# Patient Record
Sex: Male | Born: 1990 | Race: White | Hispanic: No | Smoking: Never smoker
Health system: Southern US, Community
[De-identification: ages and names within clinical notes are randomized; demographics above are authoritative.]

## PROBLEM LIST (undated history)

## (undated) DIAGNOSIS — D6851 Activated protein C resistance: Secondary | ICD-10-CM

## (undated) DIAGNOSIS — I82409 Acute embolism and thrombosis of unspecified deep veins of unspecified lower extremity: Secondary | ICD-10-CM

## (undated) DIAGNOSIS — D689 Coagulation defect, unspecified: Secondary | ICD-10-CM

## (undated) DIAGNOSIS — D6852 Prothrombin gene mutation: Secondary | ICD-10-CM

## (undated) HISTORY — DX: Coagulation defect, unspecified: D68.9

## (undated) HISTORY — PX: NO PAST SURGERIES: SHX2092

---

## 2016-11-15 DIAGNOSIS — D6852 Prothrombin gene mutation: Secondary | ICD-10-CM | POA: Insufficient documentation

## 2016-11-15 DIAGNOSIS — D6851 Activated protein C resistance: Secondary | ICD-10-CM | POA: Insufficient documentation

## 2018-03-29 ENCOUNTER — Encounter: Payer: Self-pay | Admitting: Emergency Medicine

## 2018-03-29 ENCOUNTER — Emergency Department
Admission: EM | Admit: 2018-03-29 | Discharge: 2018-03-29 | Disposition: A | Discharge status: Home / Self Care | Payer: Federal, State, Local not specified - PPO | Attending: Emergency Medicine | Admitting: Emergency Medicine

## 2018-03-29 ENCOUNTER — Emergency Department: Payer: Federal, State, Local not specified - PPO

## 2018-03-29 DIAGNOSIS — M79605 Pain in left leg: Secondary | ICD-10-CM | POA: Insufficient documentation

## 2018-03-29 HISTORY — DX: Activated protein C resistance: D68.51

## 2018-03-29 HISTORY — DX: Prothrombin gene mutation: D68.52

## 2018-03-29 HISTORY — DX: Acute embolism and thrombosis of unspecified deep veins of unspecified lower extremity: I82.409

## 2018-03-29 NOTE — ED Triage Notes (Addendum)
Pt comes into the ED via POV c/o left leg pain.  Patient has h/o DVT in the past and states this feels similar.  Patient states the pain does not get worse with ambulation but it is described as a "lingering pain".  Patient states there is some pain in his groin as well but it is minimal at this time.  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

## 2018-03-29 NOTE — ED Notes (Signed)
Patient taken to ultrasound.

## 2018-03-29 NOTE — Discharge Instructions (Addendum)
Please seek medical attention for any high fevers, chest pain, shortness of breath, change in behavior, persistent vomiting, bloody stool or any other new or concerning symptoms.  

## 2018-03-29 NOTE — ED Provider Notes (Signed)
[facility] [place] Emergency Department Provider Note  ____________________________________________   I have reviewed the triage vital signs and the nursing notes.   HISTORY  Chief Complaint Leg Pain   History limited by: Not Limited   HPI Larry Wallace is a 27 y.o. male who presents to the emergency department today because of concerns for left leg pain.  He states that initially the pain was located just above the knee.  He describes it is going in strings.  It then came closer to his groin.  Was somewhat intermittent.  It did somewhat remind the patient when he been diagnosed with DVTs in the past.  He does have factor V Leiden and is on aspirin daily.  He denies any unusual activity or trauma to the leg.  States that he went on to 1 hour flights over the previous weekend.  Feels like he had the flu last week.   Per medical record review patient has a history of DVTs in the past.   Past Medical History:  Diagnosis Date  . DVT (deep venous thrombosis) (HCC)   . Factor 5 Leiden mutation, heterozygous (HCC)   . Prothrombin mutation (HCC)     There are no active problems to display for this patient.   History reviewed. No pertinent surgical history.  Prior to Admission medications   Not on File    Allergies Patient has no known allergies.  No family history on file.  Social History Social History   Tobacco Use  . Smoking status: Never Smoker  . Smokeless tobacco: Never Used  Substance Use Topics  . Alcohol use: Yes    Frequency: Never  . Drug use: Never    Review of Systems Constitutional: Positive for fever last week. Eyes: No visual changes. ENT: No sore throat. Cardiovascular: Denies chest pain. Respiratory: Denies shortness of breath. Gastrointestinal: No abdominal pain.  No nausea, no vomiting.  No diarrhea.   Genitourinary: Negative for dysuria. Musculoskeletal: Positive for left leg pain. Skin: Negative for rash. Neurological:  Negative for headaches, focal weakness or numbness.  ____________________________________________   PHYSICAL EXAM:  VITAL SIGNS: ED Triage Vitals  Enc Vitals Group     BP 03/29/18 1257 127/72     Pulse Rate 03/29/18 1257 87     Resp 03/29/18 1257 16     Temp 03/29/18 1257 98.2 F (36.8 C)     Temp Source 03/29/18 1257 Oral     SpO2 03/29/18 1257 96 %     Weight 03/29/18 1257 220 lb (99.8 kg)     Height 03/29/18 1257 6\' 3"  (1.905 m)     Head Circumference --      Peak Flow --      Pain Score 03/29/18 1307 1   Constitutional: Alert and oriented.  Eyes: Conjunctivae are normal.  ENT      Head: Normocephalic and atraumatic.      Nose: No congestion/rhinnorhea.      Mouth/Throat: Mucous membranes are moist.      Neck: No stridor. Cardiovascular: Normal rate, regular rhythm.  No murmurs, rubs, or gallops.  Respiratory: Normal respiratory effort without tachypnea nor retractions. Breath sounds are clear and equal bilaterally. No wheezes/rales/rhonchi. Gastrointestinal: Soft and non tender. No rebound. No guarding.  Genitourinary: Deferred Musculoskeletal: Normal range of motion in all extremities. No lower extremity edema. DP 2 + in left lower extermity. Neurologic:  Normal speech and language. No gross focal neurologic deficits are appreciated.  Skin:  Skin is warm, dry  and intact. No rash noted. Psychiatric: Mood and affect are normal. Speech and behavior are normal. Patient exhibits appropriate insight and judgment.  ____________________________________________    LABS (pertinent positives/negatives)  None  ____________________________________________   EKG  None  ____________________________________________    RADIOLOGY  US lower left leg No acute DVT  ____________________________________________   PROCEDURES  Procedures  ____________________________________________   INITIAL IMPRESSION / ASSESSMENT AND PLAN / ED COURSE  Pertinent labs & imaging  results that were available during my care of the patient were reviewed by me and considered in my medical decision making (see chart for details).   Patient presented to the emergency department today with concerns for left leg pain and possible DVT.  Patient does have a history of DVTs.  Ultrasound however was negative for acute DVT.  At this point think likely musculoskeletal nature.  Discussed this with the patient.   ____________________________________________   FINAL CLINICAL IMPRESSION(S) / ED DIAGNOSES  Final diagnoses:  Left leg pain     Note: This dictation was prepared with Dragon dictation. Any transcriptional errors that result from this process are unintentional     Phineas SemenGoodman, Shirleen Mcfaul, MD 03/29/18 1511

## 2018-10-07 ENCOUNTER — Emergency Department: Payer: 59

## 2018-10-07 ENCOUNTER — Emergency Department
Admission: EM | Admit: 2018-10-07 | Discharge: 2018-10-07 | Disposition: A | Discharge status: Home / Self Care | Payer: 59 | Attending: Emergency Medicine | Admitting: Emergency Medicine

## 2018-10-07 ENCOUNTER — Other Ambulatory Visit: Payer: Self-pay

## 2018-10-07 DIAGNOSIS — I82401 Acute embolism and thrombosis of unspecified deep veins of right lower extremity: Secondary | ICD-10-CM | POA: Insufficient documentation

## 2018-10-07 DIAGNOSIS — R2241 Localized swelling, mass and lump, right lower limb: Secondary | ICD-10-CM | POA: Diagnosis not present

## 2018-10-07 DIAGNOSIS — I82402 Acute embolism and thrombosis of unspecified deep veins of left lower extremity: Secondary | ICD-10-CM | POA: Diagnosis not present

## 2018-10-07 DIAGNOSIS — M79604 Pain in right leg: Secondary | ICD-10-CM | POA: Diagnosis not present

## 2018-10-07 DIAGNOSIS — Z86718 Personal history of other venous thrombosis and embolism: Secondary | ICD-10-CM | POA: Diagnosis not present

## 2018-10-07 LAB — APTT: aPTT: 26 seconds (ref 24–36)

## 2018-10-07 LAB — CBC WITH DIFFERENTIAL/PLATELET
Abs Immature Granulocytes: 0.04 10*3/uL (ref 0.00–0.07)
Basophils Absolute: 0.1 10*3/uL (ref 0.0–0.1)
Basophils Relative: 1 %
EOS PCT: 1 %
Eosinophils Absolute: 0.1 10*3/uL (ref 0.0–0.5)
HEMATOCRIT: 44.4 % (ref 39.0–52.0)
HEMOGLOBIN: 14 g/dL (ref 13.0–17.0)
Immature Granulocytes: 1 %
LYMPHS ABS: 0.9 10*3/uL (ref 0.7–4.0)
LYMPHS PCT: 13 %
MCH: 24.5 pg — AB (ref 26.0–34.0)
MCHC: 31.5 g/dL (ref 30.0–36.0)
MCV: 77.6 fL — ABNORMAL LOW (ref 80.0–100.0)
MONO ABS: 0.7 10*3/uL (ref 0.1–1.0)
Monocytes Relative: 9 %
Neutro Abs: 5.5 10*3/uL (ref 1.7–7.7)
Neutrophils Relative %: 75 %
Platelets: 265 10*3/uL (ref 150–400)
RBC: 5.72 MIL/uL (ref 4.22–5.81)
RDW: 14.2 % (ref 11.5–15.5)
WBC: 7.2 10*3/uL (ref 4.0–10.5)
nRBC: 0 % (ref 0.0–0.2)

## 2018-10-07 LAB — BASIC METABOLIC PANEL
Anion gap: 7 (ref 5–15)
BUN: 17 mg/dL (ref 6–20)
CO2: 24 mmol/L (ref 22–32)
CREATININE: 1.09 mg/dL (ref 0.61–1.24)
Calcium: 9 mg/dL (ref 8.9–10.3)
Chloride: 105 mmol/L (ref 98–111)
GFR calc Af Amer: >60 mL/min (ref >60)
GFR calc non Af Amer: >60 mL/min (ref >60)
Glucose, Bld: 143 mg/dL — ABNORMAL HIGH (ref 70–99)
Potassium: 4.3 mmol/L (ref 3.5–5.1)
Sodium: 136 mmol/L (ref 135–145)

## 2018-10-07 LAB — PROTIME-INR
INR: 0.95
PROTHROMBIN TIME: 12.6 s (ref 11.4–15.2)

## 2018-10-07 MED ORDER — RIVAROXABAN 15 MG PO TABS
15.0000 mg | ORAL_TABLET | Freq: Once | ORAL | Status: AC
  Administered 2018-10-07: 15 mg via ORAL
  Filled 2018-10-07: qty 1

## 2018-10-07 MED ORDER — RIVAROXABAN (XARELTO) VTE STARTER PACK (15 & 20 MG): ORAL_TABLET | ORAL | 2 refills | Status: DC

## 2018-10-07 NOTE — ED Provider Notes (Signed)
[facility] Emergency Department Provider Note  ____________________________________________  Time seen: Approximately 10:34 AM  I have reviewed the triage vital signs and the nursing notes.   HISTORY  Chief Complaint Leg Swelling   HPI Larry Wallace is a 27 y.o. male with a history of factor V Leiden and one prior right lower extremity DVT on prophylactic aspirin who presents for evaluation of right lower extremity pain and swelling.  Patient was taken off of his Xarelto in 2018 after having his first DVT and was placed back on aspirin for prophylaxis by hematology.  Over the last week he has noted progressively worsening swelling and pain of his calf.  The pain is present with ambulation and weightbearing and it resolves at rest.  At this time he describes no pain while laying on the stretcher.  The pain is mild to moderate.  No chest pain or shortness of breath.  No recent travel immobilization, no trauma, no hemoptysis, no exogenous hormones.  Past Medical History:  Diagnosis Date  . DVT (deep venous thrombosis) (HCC)   . Factor 5 Leiden mutation, heterozygous (HCC)   . Prothrombin mutation (HCC)     There are no active problems to display for this patient.   No past surgical history on file.  Prior to Admission medications   Medication Sig Start Date End Date Taking? Authorizing Provider  Rivaroxaban 15 & 20 MG TBPK Take as directed on package: Start with one 15mg  tablet by mouth twice a day with food. On Day 22, switch to one 20mg  tablet once a day with food. 10/07/18   Nita Sickle, MD    Allergies Patient has no known allergies.  No family history on file.  Social History Social History   Tobacco Use  . Smoking status: Never Smoker  . Smokeless tobacco: Never Used  Substance Use Topics  . Alcohol use: Yes    Frequency: Never  . Drug use: Never    Review of Systems  Constitutional: Negative for fever. Eyes: Negative for  visual changes. ENT: Negative for sore throat. Neck: No neck pain  Cardiovascular: Negative for chest pain. Respiratory: Negative for shortness of breath. Gastrointestinal: Negative for abdominal pain, vomiting or diarrhea. Genitourinary: Negative for dysuria. Musculoskeletal: Negative for back pain. + RLE pain and swelling Skin: Negative for rash. Neurological: Negative for headaches, weakness or numbness. Psych: No SI or HI  ____________________________________________   PHYSICAL EXAM:  VITAL SIGNS: ED Triage Vitals  Enc Vitals Group     BP 10/07/18 0954 136/90     Pulse Rate 10/07/18 0954 84     Resp 10/07/18 0954 18     Temp 10/07/18 0954 97.8 F (36.6 C)     Temp Source 10/07/18 0954 Oral     SpO2 10/07/18 0954 100 %     Weight 10/07/18 0950 220 lb 0.3 oz (99.8 kg)     Height 10/07/18 0954 6' 2.5" (1.892 m)     Head Circumference --      Peak Flow --      Pain Score 10/07/18 0949 2     Pain Loc --      Pain Edu? --      Excl. in GC? --     Constitutional: Alert and oriented. Well appearing and in no apparent distress. HEENT:      Head: Normocephalic and atraumatic.         Eyes: Conjunctivae are normal. Sclera is non-icteric.  Mouth/Throat: Mucous membranes are moist.       Neck: Supple with no signs of meningismus. Cardiovascular: Regular rate and rhythm. No murmurs, gallops, or rubs. 2+ symmetrical distal pulses are present in all extremities. No JVD. Respiratory: Normal respiratory effort. Lungs are clear to auscultation bilaterally. No wheezes, crackles, or rhonchi.  Gastrointestinal: Soft, non tender, and non distended with positive bowel sounds. No rebound or guarding. Musculoskeletal: Nontender with normal range of motion in all extremities. No edema, cyanosis, or erythema of extremities. Neurologic: Normal speech and language. Face is symmetric. Moving all extremities. No gross focal neurologic deficits are appreciated. Skin: Skin is warm, dry and  intact. No rash noted. Psychiatric: Mood and affect are normal. Speech and behavior are normal.  ____________________________________________   LABS (all labs ordered are listed, but only abnormal results are displayed)  Labs Reviewed  BASIC METABOLIC PANEL - Abnormal; Notable for the following components:      Result Value   Glucose, Bld 143 (*)    All other components within normal limits  CBC WITH DIFFERENTIAL/PLATELET - Abnormal; Notable for the following components:   MCV 77.6 (*)    MCH 24.5 (*)    All other components within normal limits  PROTIME-INR  APTT   ____________________________________________  EKG  none  ____________________________________________  RADIOLOGY  I have personally reviewed the images performed during this visit and I agree with the Radiologist's read.   Interpretation by Radiologist:  US Venous Img Lower Unilateral Right  Result Date: 10/07/2018 CLINICAL DATA:  Right lower extremity pain and edema. History of prior DVT. Evaluate for acute or chronic DVT. EXAM: RIGHT LOWER EXTREMITY VENOUS DOPPLER ULTRASOUND TECHNIQUE: Gray-scale sonography with graded compression, as well as color Doppler and duplex ultrasound were performed to evaluate the lower extremity deep venous systems from the level of the common femoral vein and including the common femoral, femoral, profunda femoral, popliteal and calf veins including the posterior tibial, peroneal and gastrocnemius veins when visible. The superficial great saphenous vein was also interrogated. Spectral Doppler was utilized to evaluate flow at rest and with distal augmentation maneuvers in the common femoral, femoral and popliteal veins. COMPARISON:  None. FINDINGS: Contralateral Common Femoral Vein: Respiratory phasicity is normal and symmetric with the symptomatic side. No evidence of thrombus. Normal compressibility. Common Femoral Vein: No evidence of thrombus. Normal compressibility, respiratory  phasicity and response to augmentation. Saphenofemoral Junction: No evidence of thrombus. Normal compressibility and flow on color Doppler imaging. Profunda Femoral Vein: No evidence of thrombus. Normal compressibility and flow on color Doppler imaging. Femoral Vein: No evidence of thrombus. Normal compressibility, respiratory phasicity and response to augmentation. Popliteal Vein: No evidence of thrombus. Normal compressibility, respiratory phasicity and response to augmentation. Calf Veins: There is slightly mixed echogenic expansile occlusive thrombus within one of the paired right posterior tibial veins (images 33 - 37). The adjacent paired posterior tibial vein appears patent. Both paired peroneal veins appear patent. Superficial Great Saphenous Vein: No evidence of thrombus. Normal compressibility. Venous Reflux:  None. Other Findings:  None. IMPRESSION: Examination is positive for age-indeterminate occlusive DVT involving one of the paired posterior tibial veins. In the absence of prior examinations, an acute on chronic process is not excluded. Clinical correlation is advised. Electronically Signed   By: Simonne Come M.D.   On: 10/07/2018 10:52      ____________________________________________   PROCEDURES  Procedure(s) performed: None Procedures Critical Care performed:  None ____________________________________________   INITIAL IMPRESSION / ASSESSMENT AND PLAN / ED COURSE  27 y.o. male with a history of factor V Leiden and one prior right lower extremity DVT on prophylactic aspirin who presents for evaluation of right lower extremity pain and swelling.  Patient is well-appearing and in no distress, with normal vital signs.  No clinical signs of PE with no chest pain, shortness of breath, tachypnea, tachycardia, or hypoxia.  Exam of his legs does not show any asymmetric swelling or erythema.  Both extremities are warm and well perfused with 2+ pulses.  No signs of ischemia or cellulitis.   We will send patient for Doppler venous studies to rule out DVT.    _________________________ 1:10 PM on 10/07/2018 -----------------------------------------  Doppler ultrasound positive for DVT of the posterior tibial vein.  Patient remains with no hypoxia, tachycardia, tachypnea, chest pain or shortness of breath.  Was started on Xarelto.  [facility]  Discussed return precautions for any episodes of chest pain, shortness of breath, progressive swelling of his lower extremity.   As part of my medical decision making, I reviewed the following data within the electronic MEDICAL RECORD NUMBER Nursing notes reviewed and incorporated, Labs reviewed , Old chart reviewed, Radiograph reviewed , Notes from prior ED visits and [place] Controlled Substance Database    Pertinent labs & imaging results that were available during my care of the patient were reviewed by me and considered in my medical decision making (see chart for details).    ____________________________________________   FINAL CLINICAL IMPRESSION(S) / ED DIAGNOSES  Final diagnoses:  Acute deep vein thrombosis (DVT) of right lower extremity, unspecified vein (HCC)      NEW MEDICATIONS STARTED DURING THIS VISIT:  ED Discharge Orders         Ordered    Rivaroxaban 15 & 20 MG TBPK     10/07/18 1306           Note:  This document was prepared using Dragon voice recognition software and may include unintentional dictation errors.    Nita SickleVeronese, [place], MD 10/07/18 1311

## 2018-10-07 NOTE — ED Triage Notes (Signed)
C?O right calf pain and swelling x 3 days.  Seen at Urgent Care, sent to ED for eval.  Patient has history of DVT x 2.  Not currently on anticoagulation.

## 2018-10-07 NOTE — Discharge Instructions (Addendum)
Please follow-up with your hematologist in a week.  Take Xarelto as prescribed.  Return to the emergency room if you have any chest pain or shortness of breath.

## 2018-10-07 NOTE — ED Notes (Signed)
Patient transported to Ultrasound 

## 2018-10-13 IMAGING — US US EXTREM LOW VENOUS*L*
1 series · 13 of 24 positions shown · non-contrast
Comparison: None.

CLINICAL DATA: Left lower extremity pain and edema. Reported
history of prior DVT involving the left posterior tibial vein as
well as SVT involving the greater saphenous vein on venous Doppler
ultrasound performed on [DATE] at outside institution. Evaluate for
acute or chronic DVT.



[Series 1: us extrem low venous*left* · 0.08mm/px · 13 of 36 slices shown]
[im 1/36]
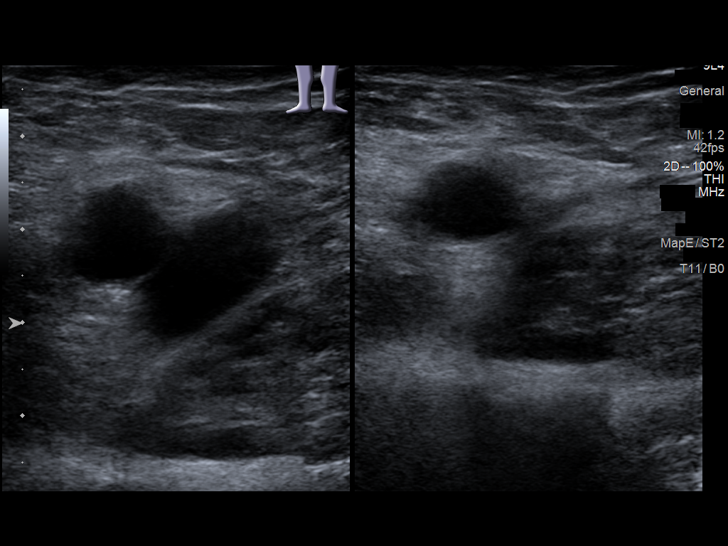
[im 4/36]
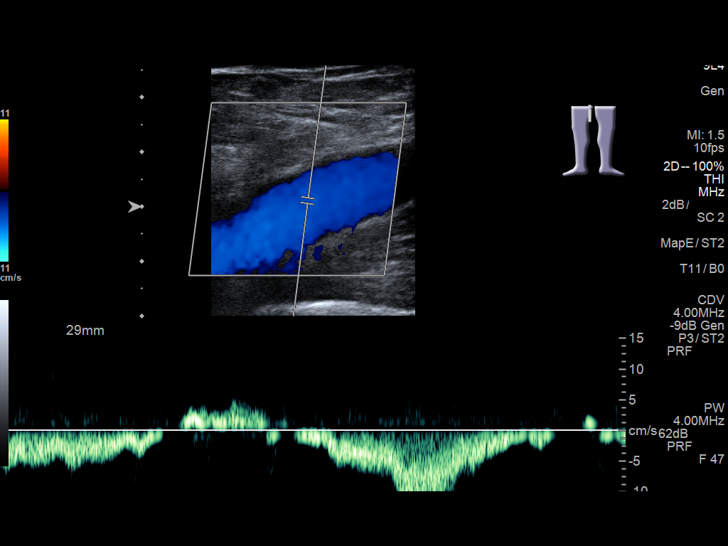
[im 7/36]
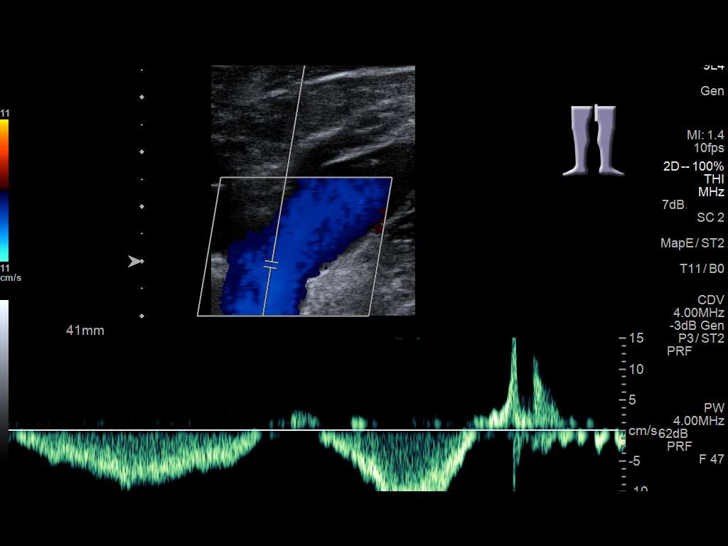
[im 10/36]
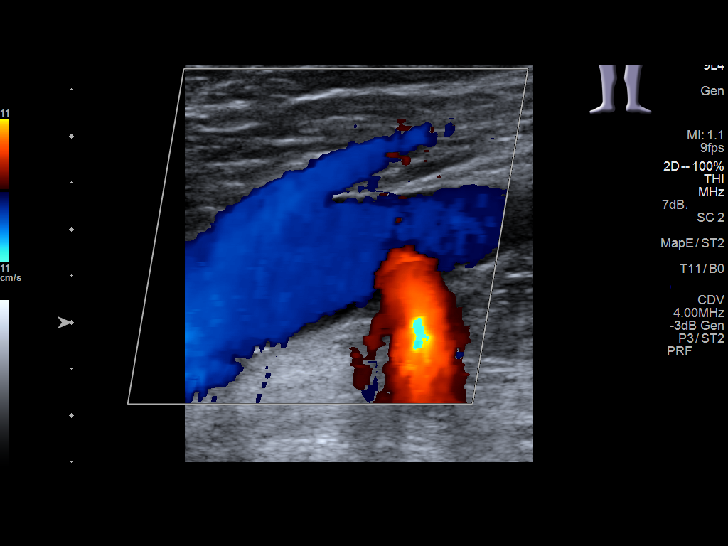
[im 13/36]
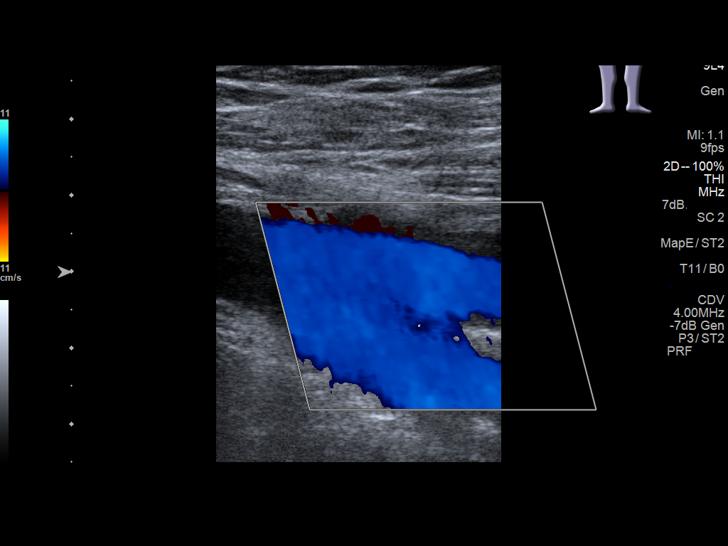
[im 16/36]
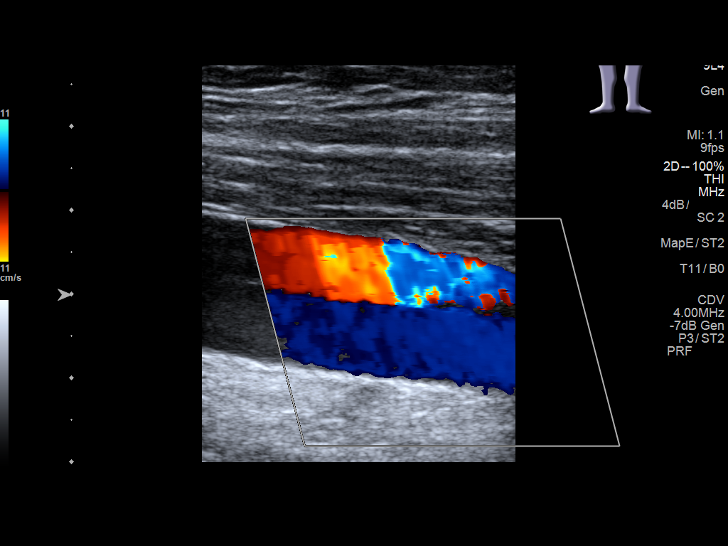
[im 19/36]
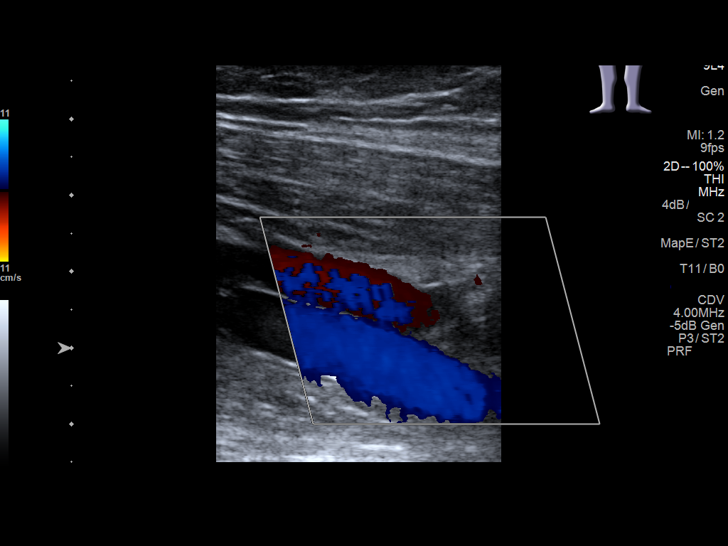
[im 20/36]
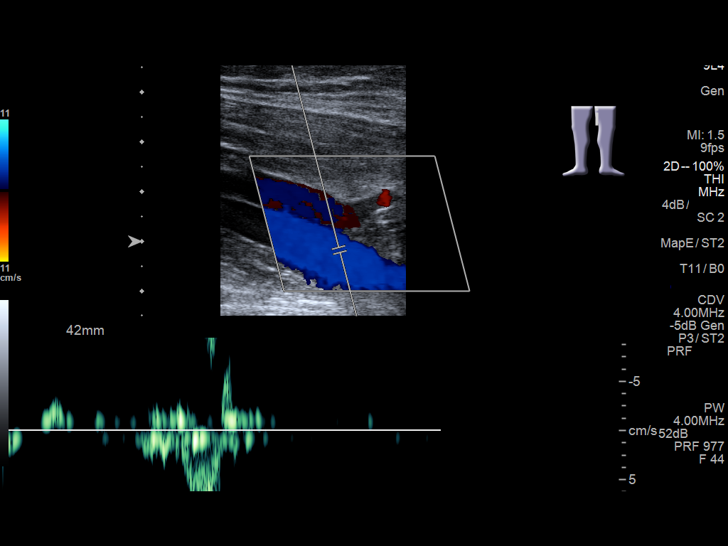
[im 23/36]
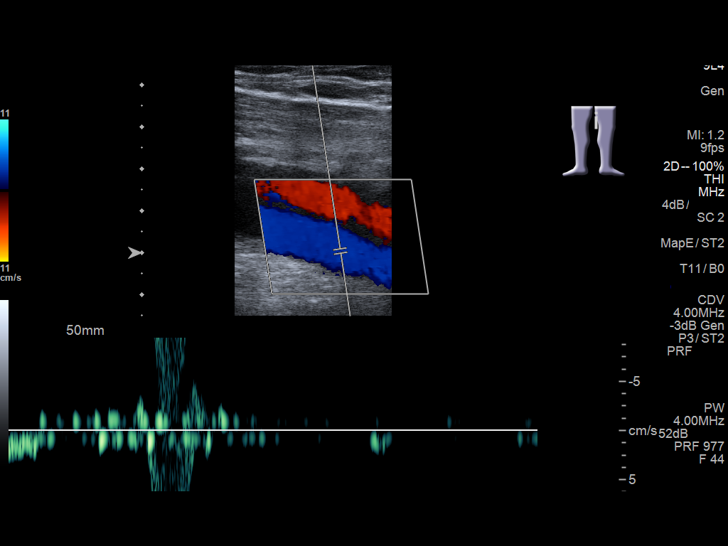
[im 26/36]
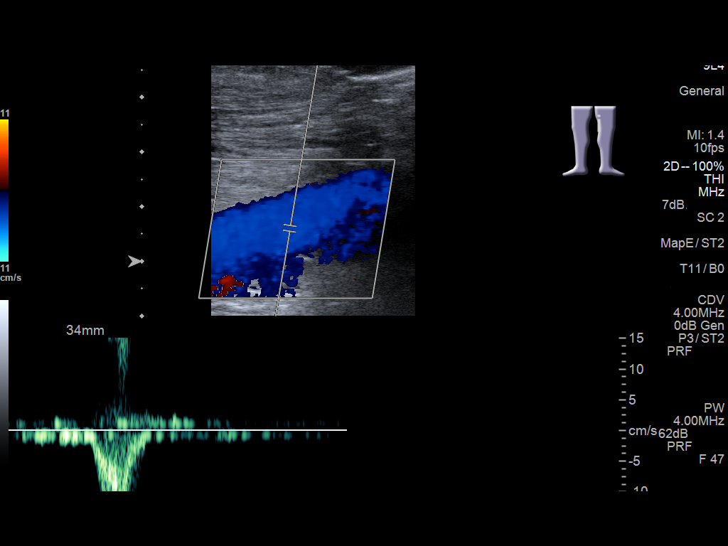
[im 29/36]
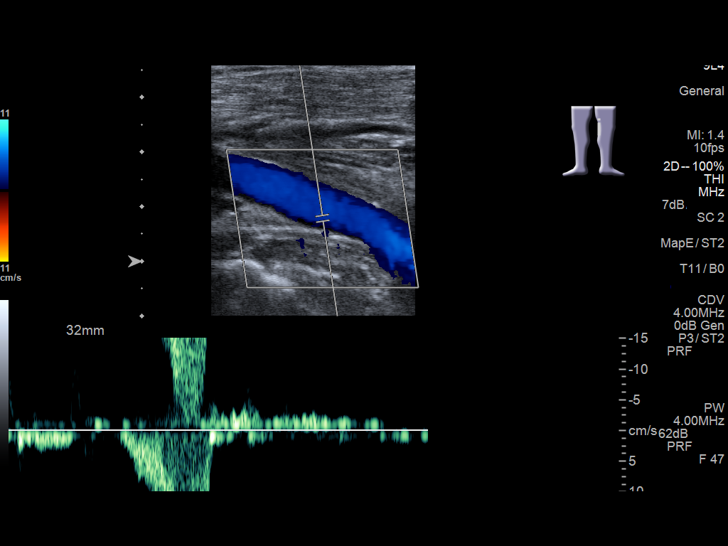
[im 32/36]
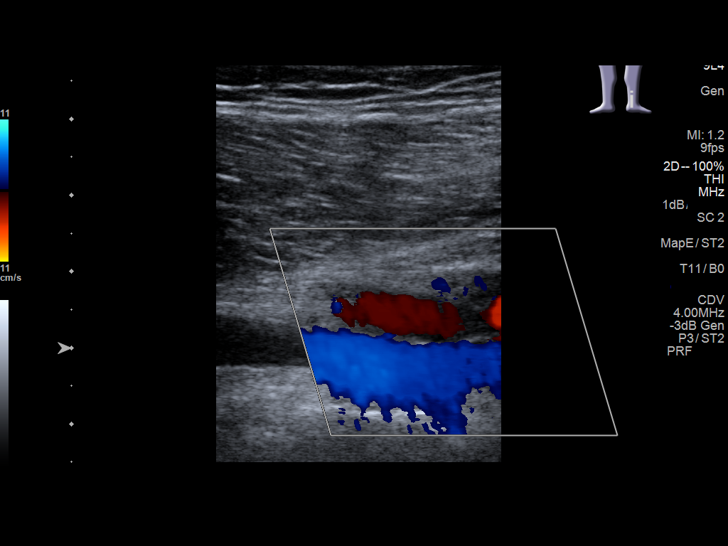
[im 36/36]
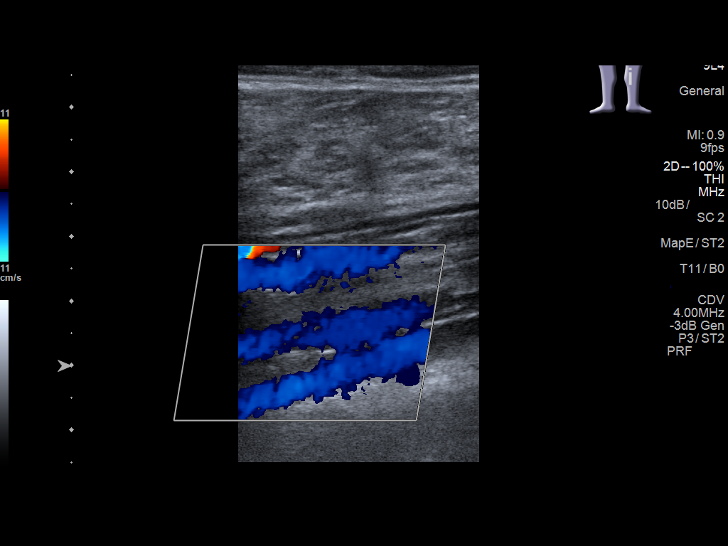

[13 of 24 positions shown; findings below may reference images not displayed]

FINDINGS: Contralateral Common Femoral Vein: Respiratory phasicity is normal
and symmetric with the symptomatic side. No evidence of thrombus.
Normal compressibility.

Common Femoral Vein: No evidence of thrombus. Normal
compressibility, respiratory phasicity and response to augmentation.

Saphenofemoral Junction: No evidence of thrombus. Normal
compressibility and flow on color Doppler imaging.

Profunda Femoral Vein: No evidence of thrombus. Normal
compressibility and flow on color Doppler imaging.

Femoral Vein: No evidence of thrombus. Normal compressibility,
respiratory phasicity and response to augmentation.

Popliteal Vein: No evidence of thrombus. Normal compressibility,
respiratory phasicity and response to augmentation.

Calf Veins: No evidence of thrombus. Normal compressibility and flow
on color Doppler imaging.

Superficial Great Saphenous Vein: Appears patent proximally.

Venous Reflux:  None.

Other Findings:  None.
IMPRESSION: No evidence of acute or chronic DVT or SVT with special attention
paid to the left posterior tibial and greater saphenous veins.

## 2018-12-11 ENCOUNTER — Encounter (INDEPENDENT_AMBULATORY_CARE_PROVIDER_SITE_OTHER): Payer: Self-pay

## 2018-12-11 ENCOUNTER — Encounter: Payer: Self-pay | Admitting: Family Medicine

## 2018-12-11 ENCOUNTER — Ambulatory Visit: Payer: 59 | Admitting: Family Medicine

## 2018-12-11 VITALS — BP 128/84 | HR 59 | Temp 98.5°F | Ht 74.25 in | Wt 228.2 lb

## 2018-12-11 DIAGNOSIS — L989 Disorder of the skin and subcutaneous tissue, unspecified: Secondary | ICD-10-CM | POA: Diagnosis not present

## 2018-12-11 DIAGNOSIS — Z23 Encounter for immunization: Secondary | ICD-10-CM | POA: Diagnosis not present

## 2018-12-11 DIAGNOSIS — I82401 Acute embolism and thrombosis of unspecified deep veins of right lower extremity: Secondary | ICD-10-CM

## 2018-12-11 DIAGNOSIS — D6852 Prothrombin gene mutation: Secondary | ICD-10-CM

## 2018-12-11 DIAGNOSIS — D6851 Activated protein C resistance: Secondary | ICD-10-CM

## 2018-12-11 MED ORDER — RIVAROXABAN 20 MG PO TABS: 20.0000 mg | ORAL_TABLET | Freq: Every day | ORAL | 3 refills | Status: DC

## 2018-12-11 NOTE — Assessment & Plan Note (Signed)
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 Has gone ~1 month w/o rivaroxaban, but did get initial treatment. Will restart at maintenance dose. Referral to heme to day for further discussion regarding length of treatment.

## 2018-12-11 NOTE — Patient Instructions (Addendum)
-   Restart the Xarelto today - hematology referral today   Lesion on your foot - could try over the counter wart treatment to see if that helps - otherwise, just watch if becoming painful or growing in size let me know

## 2018-12-11 NOTE — Progress Notes (Signed)
Subjective:     Larry Wallace is a 28 y.o. male presenting for Establish Care (no previous PCP. ) and DVT (was seen hematologist with [facility] but now has different insurance and that provider is not covered. Needs a referral to another provider.)     HPI   #Factor V Leiden #Acute DVT - Right leg - diagnosed 10/07/2018 - started on xarelto - was seeing a provider in [place], but no longer with insurance plan  - needs a new referral - ran out of medication in mid January  - only taking 81 mg of ASA - not opposed to taking longterm anticoagulation - all clots have been below the knee thus far - pain is better in that leg  #Lesion on left foot - noticed it a few weeks ago - not painful    Review of Systems  Respiratory: Negative for cough, chest tightness and shortness of breath.   Cardiovascular: Negative for chest pain and palpitations.     Social History   Tobacco Use  Smoking Status Never Smoker  Smokeless Tobacco Never Used        Objective:    BP Readings from Last 3 Encounters:  12/11/18 128/84  10/07/18 121/89  03/29/18 124/86   Wt Readings from Last 3 Encounters:  12/11/18 228 lb 4 oz (103.5 kg)  10/07/18 220 lb (99.8 kg)  03/29/18 220 lb (99.8 kg)    BP 128/84   Pulse (!) 59   Temp 98.5 F (36.9 C)   Ht 6' 2.25" (1.886 m)   Wt 228 lb 4 oz (103.5 kg)   SpO2 97%   BMI 29.11 kg/m    Physical Exam Constitutional:      Appearance: Normal appearance. He is not ill-appearing or diaphoretic.  HENT:     Right Ear: External ear normal.     Left Ear: External ear normal.     Nose: Nose normal.  Eyes:     General: No scleral icterus.    Extraocular Movements: Extraocular movements intact.     Conjunctiva/sclera: Conjunctivae normal.  Neck:     Musculoskeletal: Neck supple.  Cardiovascular:     Rate and Rhythm: Normal rate and regular rhythm.     Heart sounds: No murmur.  Pulmonary:     Effort: Pulmonary effort is normal. No  respiratory distress.     Breath sounds: Normal breath sounds. No wheezing or rales.  Skin:    General: Skin is warm and dry.     Comments: Small nodular lesion with central dimple on the right plantar surface of the foot.   Neurological:     Mental Status: He is alert. Mental status is at baseline.  Psychiatric:        Mood and Affect: Mood normal.           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 Has gone ~1 month w/o rivaroxaban, but did get initial treatment. Will restart at maintenance dose. Referral to heme to day for further discussion regarding length of treatment.       Relevant Medications   rivaroxaban (XARELTO) 20 MG TABS tablet   Other Relevant Orders   Ambulatory referral to Hematology     Musculoskeletal and Integument   Skin lesion of foot  Etiology unclear. Could be wart. No discoloration so cancer less likely. Recommended watch and wait.         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  Lynnda Child, MD

## 2018-12-11 NOTE — Assessment & Plan Note (Signed)
Etiology unclear. Could be wart. No discoloration so cancer less likely. Recommended watch and wait.

## 2018-12-30 NOTE — Progress Notes (Signed)
[facility]  [phone] [phone]) [phone]  ID: Larry Wallace OB: [birth year]  MR#: [id number]  FXO#:[id number]  Patient Care Team: Lynnda Child, MD as PCP - General (Family Medicine)  CHIEF COMPLAINT: Right leg DVT.  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  He had a superficial clot approximately 4 years ago and then a true DVT in 2018 that was evaluated at [facility].  He took several months of anticoagulation at that point, then discontinued.  More recently in December 2019 patient had his third unprovoked DVT.  He is currently on Xarelto.  He is referred to clinic today for further evaluation and discussion of whether or not to proceed with lifelong anticoagulation.  He currently feels well and is asymptomatic.  He has no neurologic complaints.  He denies any recent fevers or illnesses.  He has had no extended travel.  He has a good appetite and denies weight loss.  He has no chest pain or shortness of breath.  He denies any nausea, vomiting, constipation, or diarrhea.  He has no urinary complaints.  Patient feels at his baseline offers no specific complaints today.  REVIEW OF SYSTEMS:   Review of Systems  Constitutional: Negative.  Negative for fever, malaise/fatigue and weight loss.  Respiratory: Negative.  Negative for cough, hemoptysis and shortness of breath.   Cardiovascular: Negative.  Negative for chest pain and leg swelling.  Gastrointestinal: Negative.  Negative for abdominal pain and blood in stool.  Genitourinary: Negative.  Negative for dysuria.  Musculoskeletal: Negative for back pain.  Skin: Negative.  Negative for rash.  Neurological: Negative.  Negative for dizziness, focal weakness, weakness and headaches.  Psychiatric/Behavioral: Negative.  The patient is not nervous/anxious.     As per HPI. Otherwise, a complete review of systems is  negative.  PAST MEDICAL HISTORY: Past Medical History:  Diagnosis Date  . Clotting disorder (HCC)   . DVT (deep venous thrombosis) (HCC)   . Factor 5 Leiden mutation, heterozygous (HCC)   . Prothrombin mutation (HCC)     PAST SURGICAL HISTORY: Past Surgical History:  Procedure Laterality Date  . NO PAST SURGERIES      FAMILY HISTORY: Family History  Problem Relation Age of Onset  . Factor V Leiden deficiency Mother   . Deep vein thrombosis Father   . Other Father        Prothrombin mutation  . Factor V Leiden deficiency Sister     ADVANCED DIRECTIVES (Y/N):  N  HEALTH MAINTENANCE: Social History   Tobacco Use  . Smoking status: Never Smoker  . Smokeless tobacco: Never Used  Substance Use Topics  . Alcohol use: Yes    Frequency: Never    Comment: once a week, 2-3 drinks  . Drug use: Never     Colonoscopy:  PAP:  Bone density:  Lipid panel:  No Known Allergies  Current Outpatient Medications  Medication Sig Dispense Refill  . rivaroxaban (XARELTO) 20 MG TABS tablet Take 1 tablet (20 mg total) by mouth daily with supper. 30 tablet 3   No current facility-administered medications for this visit.     OBJECTIVE: Vitals:   01/01/19 1518  BP: 127/60  Pulse: 82  Resp: 18  Temp: 98.2 F (36.8 C)     Body mass index is 28.39 kg/m.    ECOG FS:0 - Asymptomatic  General: Well-developed, well-nourished, no acute distress. Eyes: Pink conjunctiva, anicteric  sclera. HEENT: Normocephalic, moist mucous membranes, clear oropharnyx. Lungs: Clear to auscultation bilaterally. Heart: Regular rate and rhythm. No rubs, murmurs, or gallops. Abdomen: Soft, nontender, nondistended. No organomegaly noted, normoactive bowel sounds. Musculoskeletal: No edema, cyanosis, or clubbing. Neuro: Alert, answering all questions appropriately. Cranial nerves grossly intact. Skin: No rashes or petechiae noted. Psych: Normal affect. Lymphatics: No cervical, calvicular, axillary or  inguinal LAD.   LAB RESULTS:  Lab Results  Component Value Date   NA 136 10/07/2018   K 4.3 10/07/2018   CL 105 10/07/2018   CO2 24 10/07/2018   GLUCOSE 143 (H) 10/07/2018   BUN 17 10/07/2018   CREATININE 1.09 10/07/2018   CALCIUM 9.0 10/07/2018   GFRNONAA >60 10/07/2018   GFRAA >60 10/07/2018    Lab Results  Component Value Date   WBC 7.2 10/07/2018   NEUTROABS 5.5 10/07/2018   HGB 14.0 10/07/2018   HCT 44.4 10/07/2018   MCV 77.6 (L) 10/07/2018   PLT 265 10/07/2018     STUDIES: No results found.  ASSESSMENT: Right leg DVT, compound heterozygote for factor V Leiden and prothrombin gene mutation.  PLAN:    1. Right leg DVT: Patient has documented factor V Leiden and prothrombin gene mutations.  Patient had a superficial clot approximately 4 years ago and then a true DVT in 2018 that was evaluated at [facility].  He took several months of anticoagulation at that point, then discontinued.  More recently in December 2019 patient had his third unprovoked DVT.  He is currently on Xarelto.  Given patient's significant risk for developing unprovoked DVT and the fact that this is his second lifetime DVT, I have recommended he remain on lifelong anticoagulation.  Patient expressed understanding of the risks and benefits of taking lifelong anticoagulation and agreed to proceed with Xarelto as prescribed.  No further follow-up is necessary.  Please for patient back if there are any questions or concerns.  I spent a total of 60 minutes face-to-face with the patient of which greater than 50% of the visit was spent in counseling and coordination of care as detailed above.   Patient expressed understanding and was in agreement with this plan. He also understands that He can call clinic at any time with any questions, concerns, or complaints.    Jeralyn Ruths, MD   01/04/2019 9:19 AM

## 2019-01-01 ENCOUNTER — Inpatient Hospital Stay: Payer: 59 | Attending: Oncology | Admitting: Oncology

## 2019-01-01 ENCOUNTER — Encounter: Payer: Self-pay | Admitting: Oncology

## 2019-01-01 ENCOUNTER — Other Ambulatory Visit: Payer: Self-pay

## 2019-01-01 VITALS — BP 127/60 | HR 82 | Temp 98.2°F | Resp 18 | Ht 74.5 in | Wt 224.1 lb

## 2019-01-01 DIAGNOSIS — Z7901 Long term (current) use of anticoagulants: Secondary | ICD-10-CM | POA: Diagnosis not present

## 2019-01-01 DIAGNOSIS — D6851 Activated protein C resistance: Secondary | ICD-10-CM | POA: Insufficient documentation

## 2019-01-01 DIAGNOSIS — Z86718 Personal history of other venous thrombosis and embolism: Secondary | ICD-10-CM

## 2019-01-01 DIAGNOSIS — I82401 Acute embolism and thrombosis of unspecified deep veins of right lower extremity: Secondary | ICD-10-CM

## 2019-01-01 NOTE — Progress Notes (Signed)
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 (DVT in right calf). Referred by Dr. Selena Batten.

## 2019-04-23 ENCOUNTER — Other Ambulatory Visit: Payer: Self-pay | Admitting: Family Medicine

## 2019-04-23 DIAGNOSIS — I82401 Acute embolism and thrombosis of unspecified deep veins of right lower extremity: Secondary | ICD-10-CM

## 2019-10-16 IMAGING — US US EXTREM LOW VENOUS*R*
1 series · 13 of 24 positions shown · non-contrast
Comparison: None.

CLINICAL DATA: Right lower extremity pain and edema. History of
prior DVT. Evaluate for acute or chronic DVT.



[Series 1: us extrem low venous*right* · 13 of 43 slices shown]
[im 1/43]
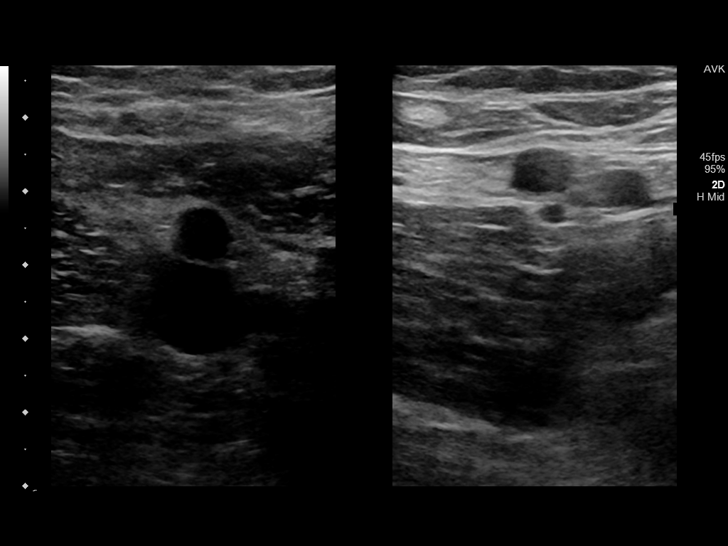
[im 4/43]
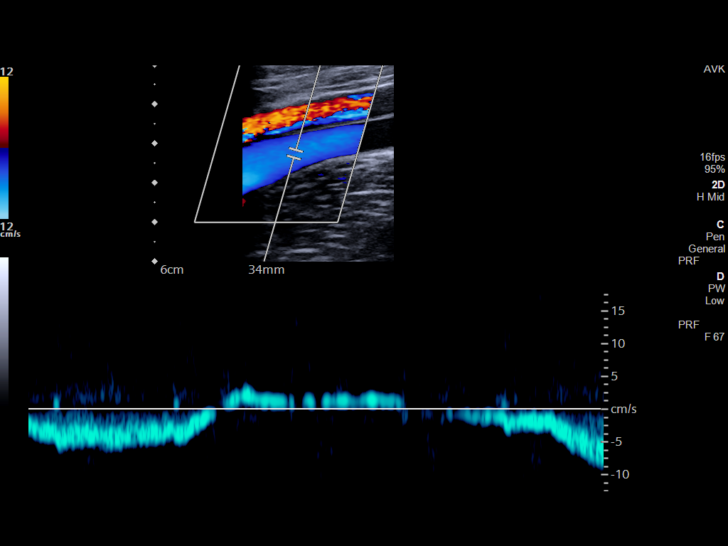
[im 8/43]
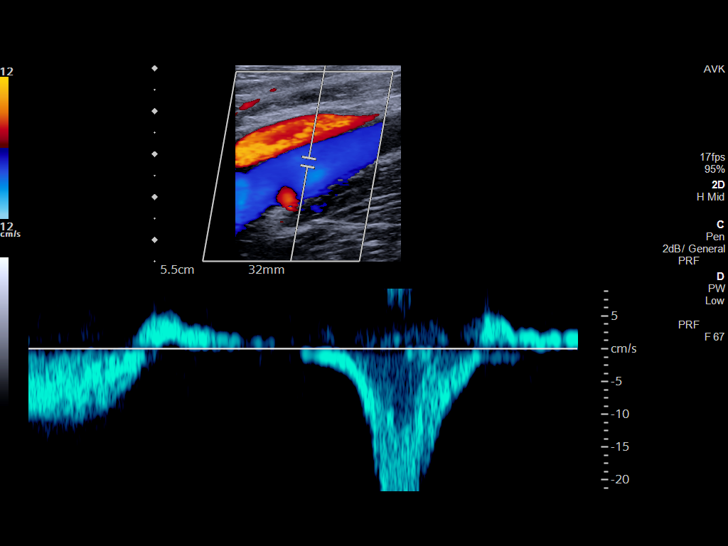
[im 11/43]
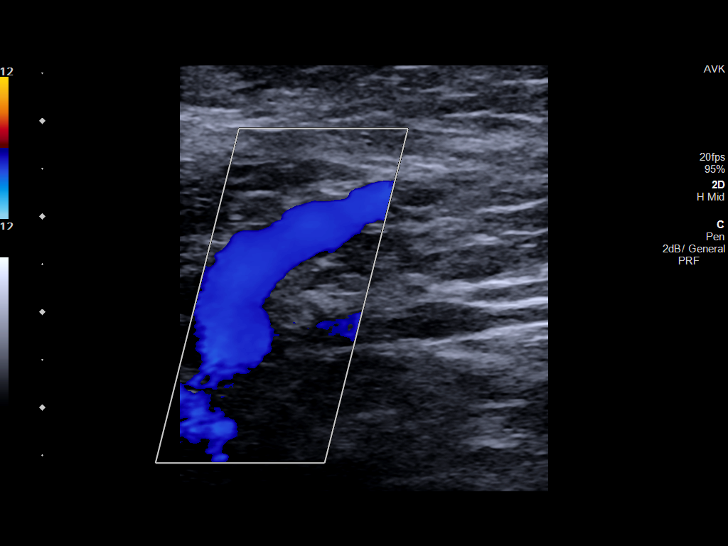
[im 15/43]
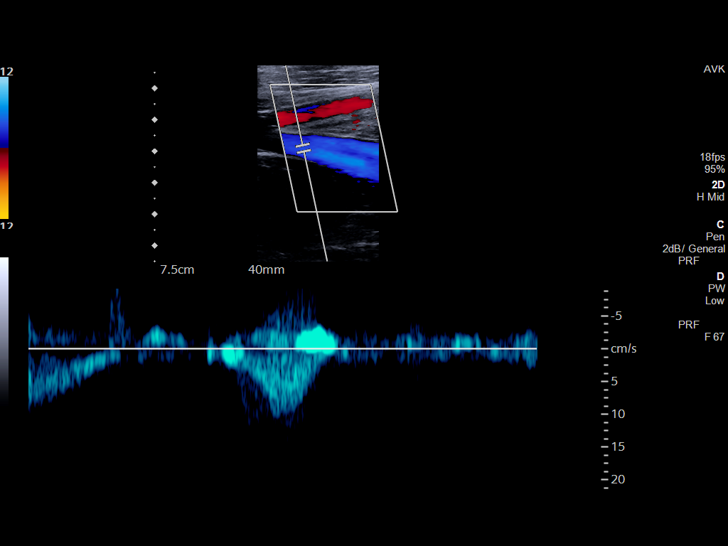
[im 19/43]
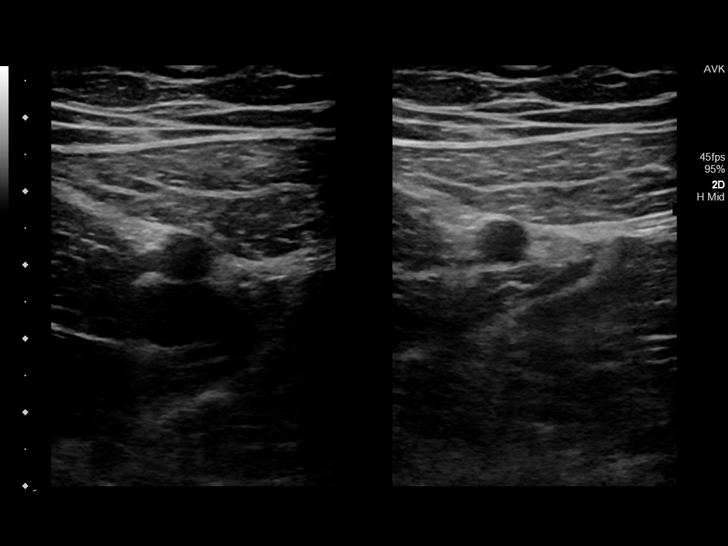
[im 22/43]
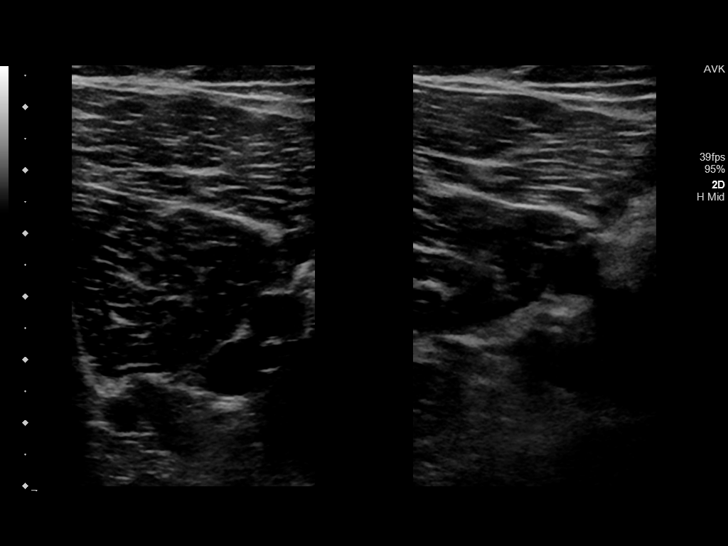
[im 24/43]
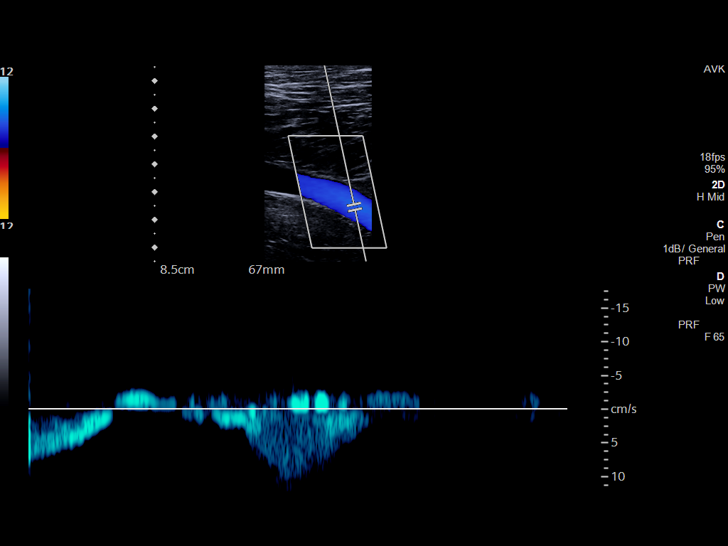
[im 28/43]
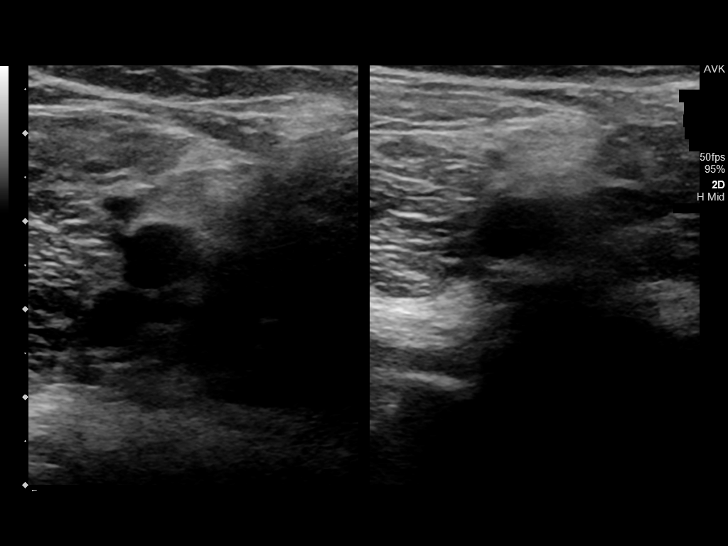
[im 32/43]
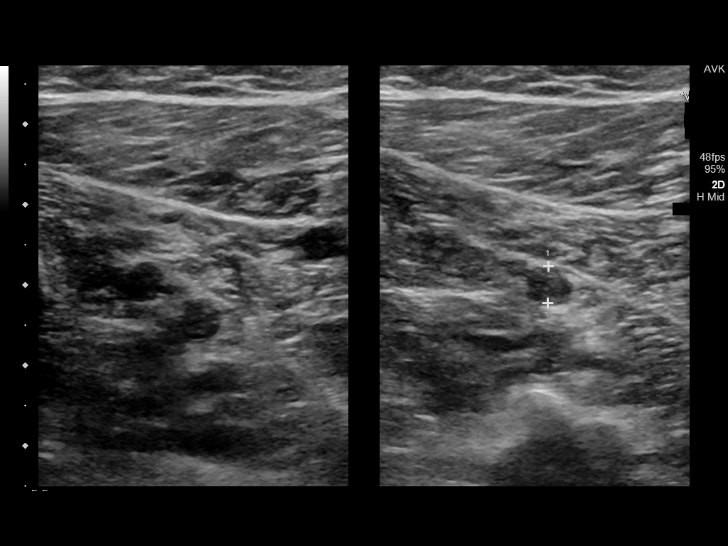
[im 35/43]
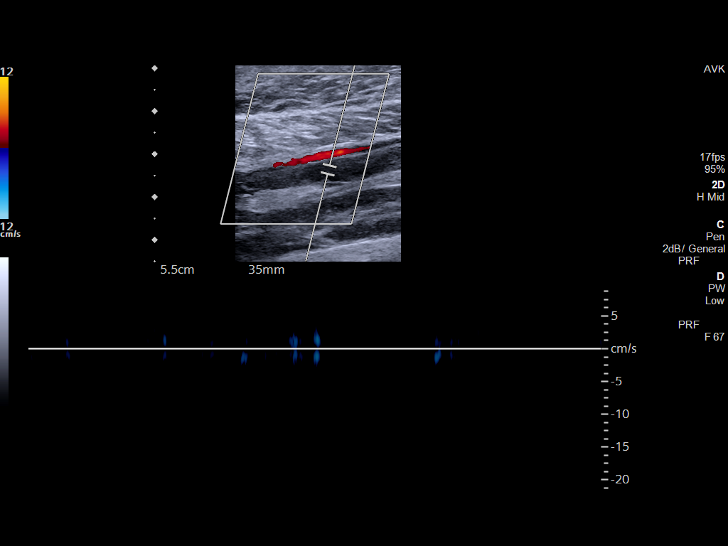
[im 39/43]
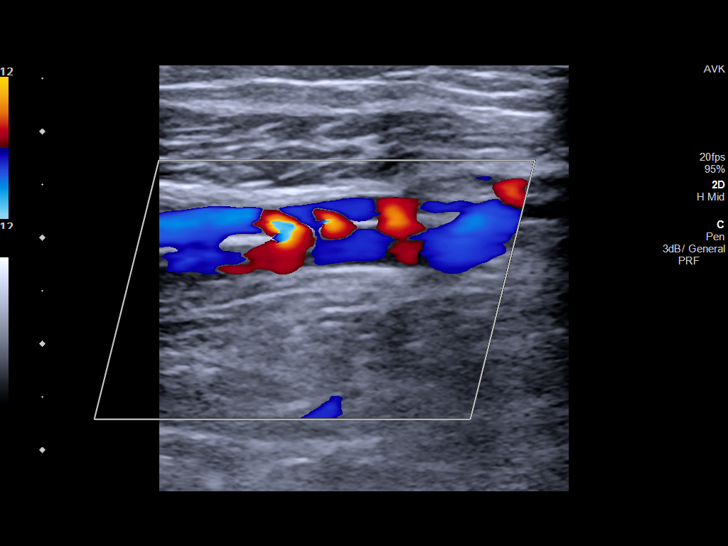
[im 43/43]
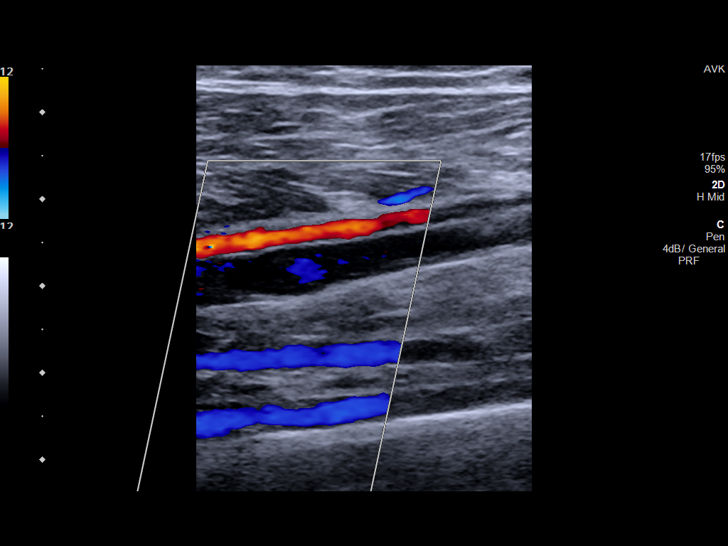

[13 of 24 positions shown; findings below may reference images not displayed]

FINDINGS: Contralateral Common Femoral Vein: Respiratory phasicity is normal
and symmetric with the symptomatic side. No evidence of thrombus.
Normal compressibility.

Common Femoral Vein: No evidence of thrombus. Normal
compressibility, respiratory phasicity and response to augmentation.

Saphenofemoral Junction: No evidence of thrombus. Normal
compressibility and flow on color Doppler imaging.

Profunda Femoral Vein: No evidence of thrombus. Normal
compressibility and flow on color Doppler imaging.

Femoral Vein: No evidence of thrombus. Normal compressibility,
respiratory phasicity and response to augmentation.

Popliteal Vein: No evidence of thrombus. Normal compressibility,
respiratory phasicity and response to augmentation.

Calf Veins: There is slightly mixed echogenic expansile occlusive
thrombus within one of the paired right posterior tibial veins
(images 33 - 37). The adjacent paired posterior tibial vein appears
patent. Both paired peroneal veins appear patent.

Superficial Great Saphenous Vein: No evidence of thrombus. Normal
compressibility.

Venous Reflux:  None.

Other Findings:  None.
IMPRESSION: Examination is positive for age-indeterminate occlusive DVT
involving one of the paired posterior tibial veins. In the absence
of prior examinations, an acute on chronic process is not excluded.
Clinical correlation is advised.

## 2020-05-14 ENCOUNTER — Other Ambulatory Visit: Payer: Self-pay | Admitting: Family Medicine

## 2020-05-14 DIAGNOSIS — I82401 Acute embolism and thrombosis of unspecified deep veins of right lower extremity: Secondary | ICD-10-CM

## 2020-05-15 NOTE — Telephone Encounter (Signed)
Please schedule office visit with Dr. Selena Batten.  Patient hasn't been seen here in over a year.  Needs appointment to continuing getting his medication refills.

## 2020-05-21 NOTE — Telephone Encounter (Signed)
Called patient to schedule appointment to receive medication refills. Patient moved to [place] and established with a  new PCP. Patient will transfer medications to new PCP.
